# Patient Record
Sex: Male | Born: 2005 | Race: Black or African American | Hispanic: No | Marital: Single | Smoking: Never smoker
Health system: Southern US, Community
[De-identification: ages and names within clinical notes are randomized; demographics above are authoritative.]

## PROBLEM LIST (undated history)

## (undated) HISTORY — PX: TONSILLECTOMY: SUR1361

---
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
## 2023-02-10 IMAGING — CR DG TIBIA/FIBULA 2V*R*
2 series · 2 of 2 positions shown · non-contrast
Comparison: None.

CLINICAL DATA: Right leg pain without recent injury.

EXAM:
RIGHT TIBIA AND FIBULA - 2 VIEW

[x tib-fib ap right (1 of 2)]
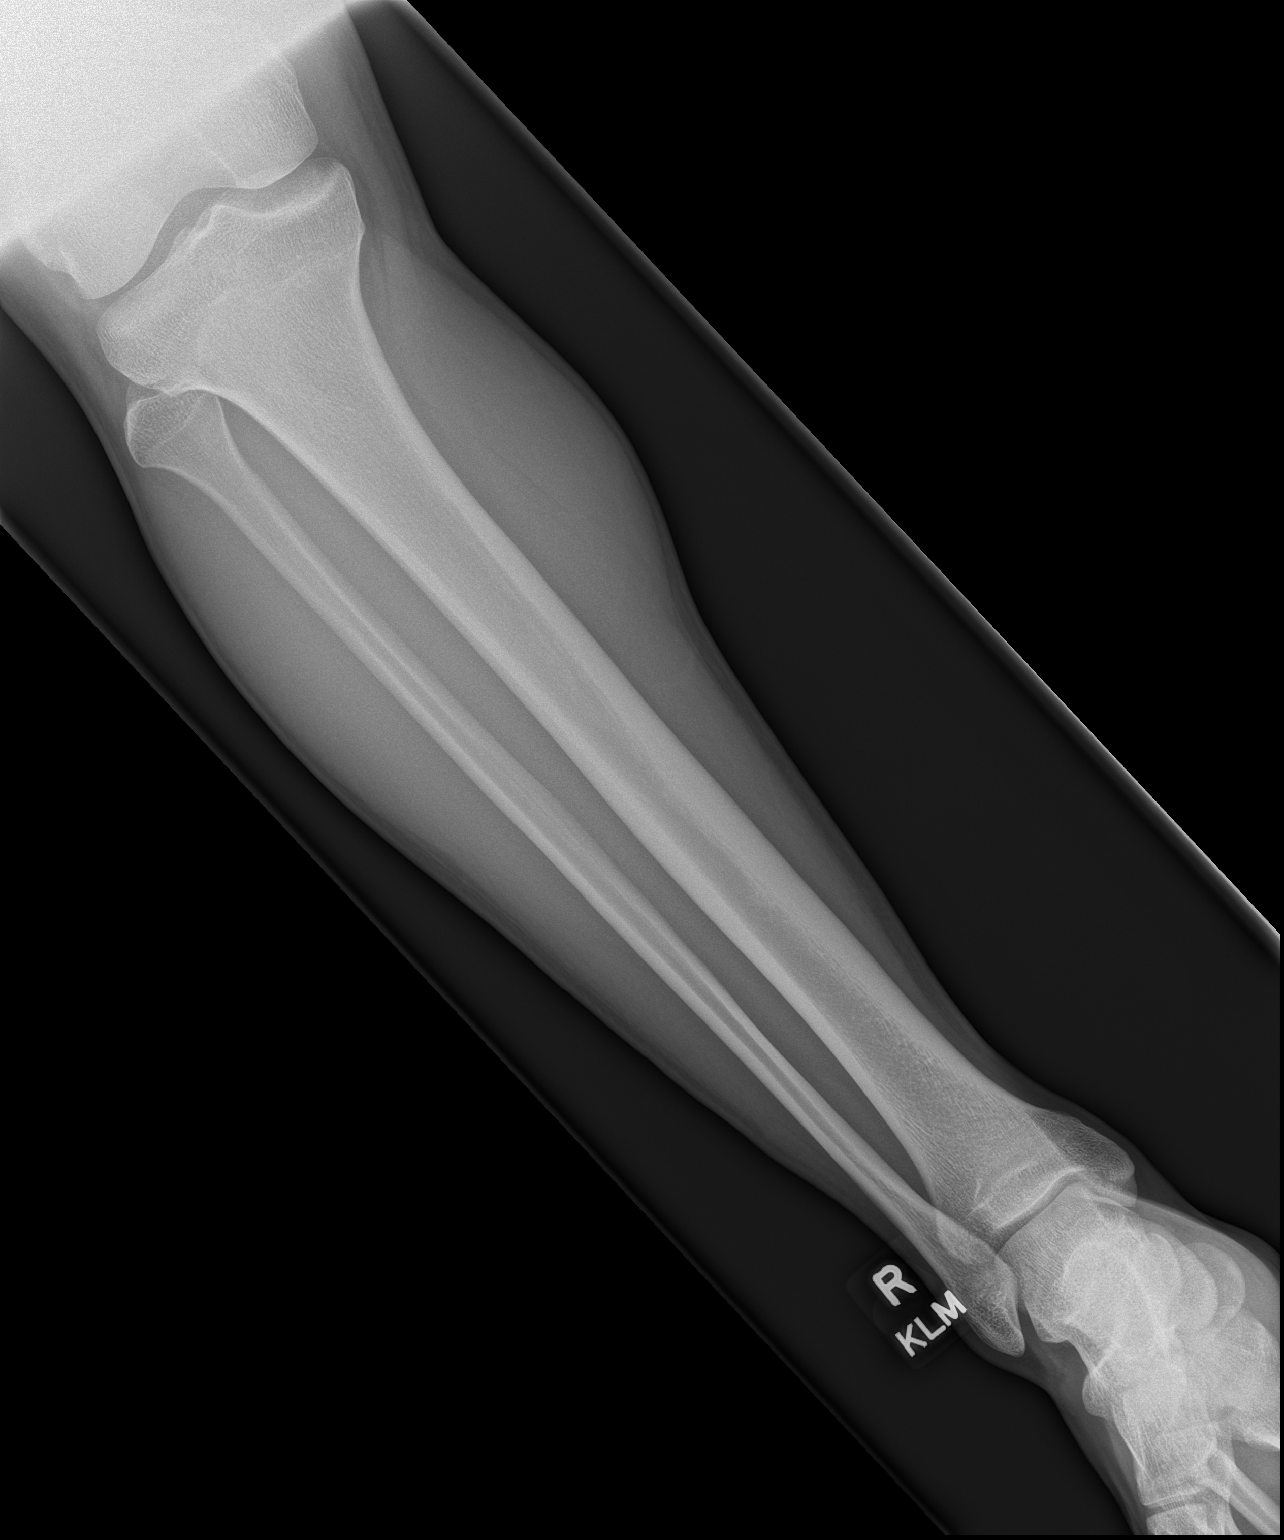

[x tib-fib ap right (2 of 2)]
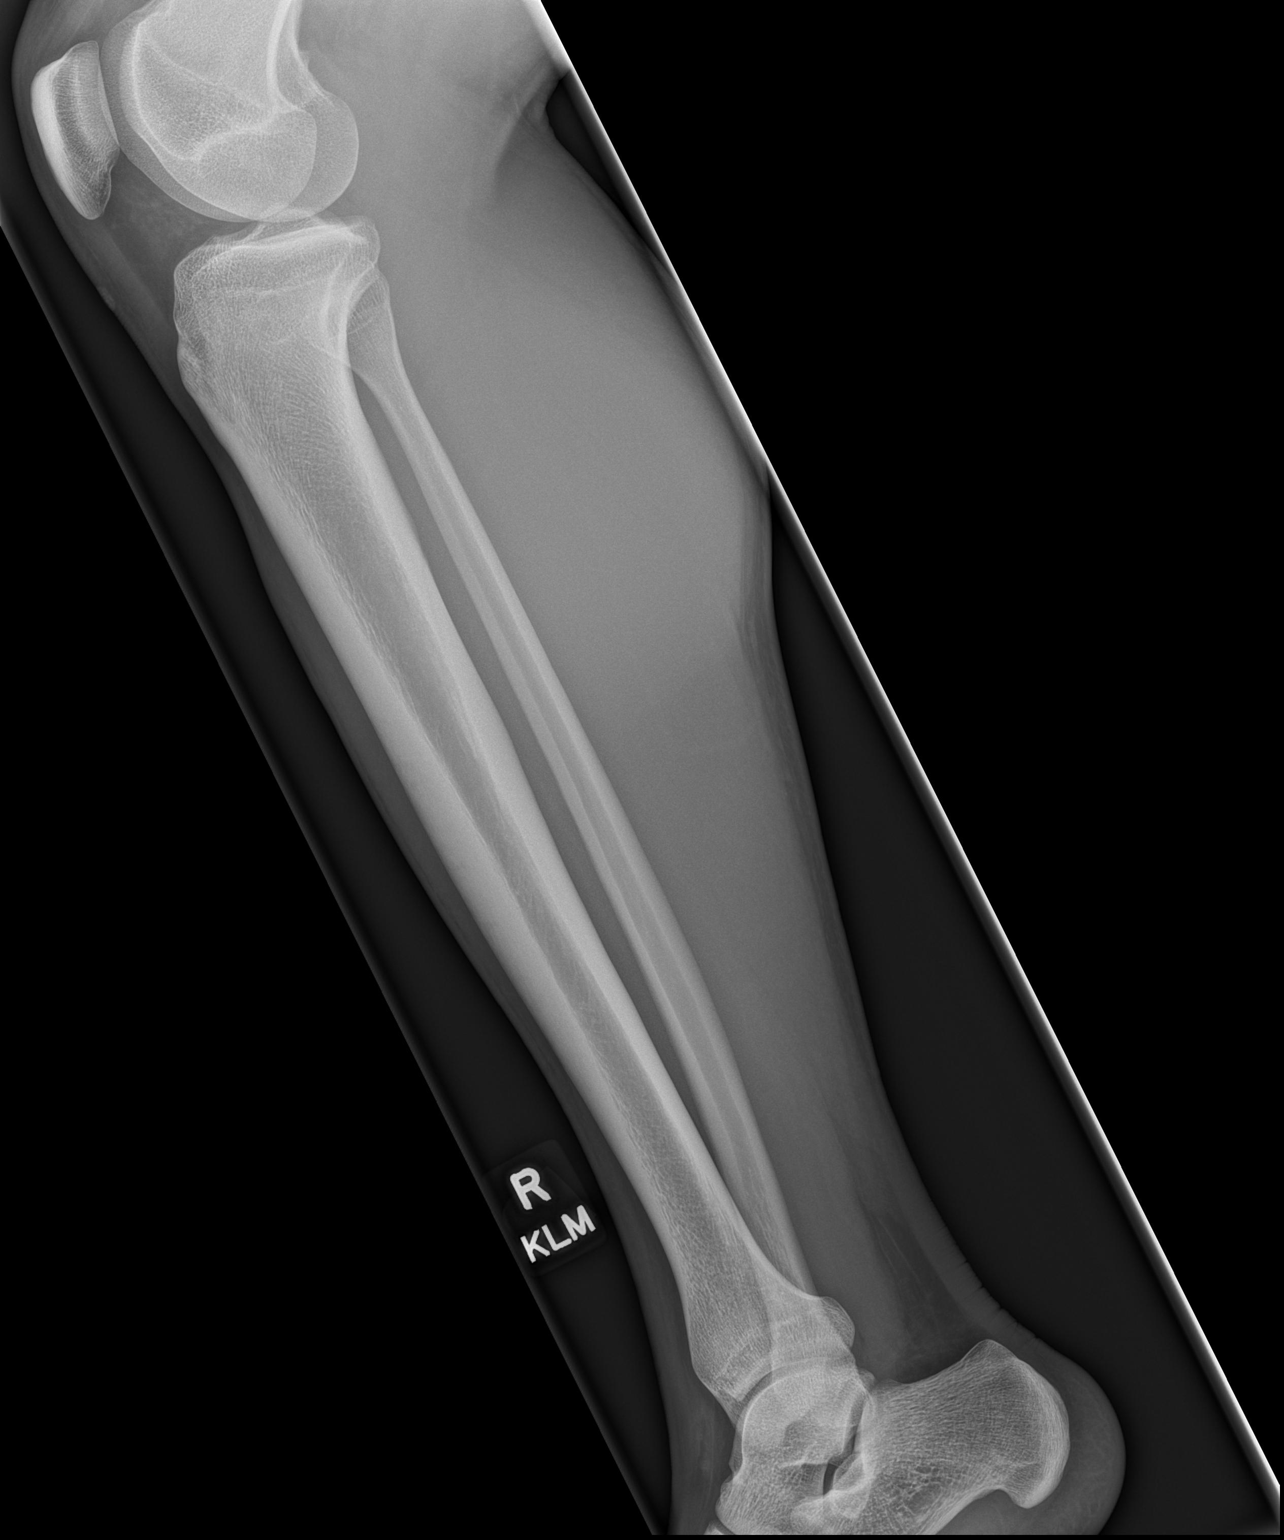

[2 of 2 positions shown; findings below may reference images not displayed]

FINDINGS: There is no evidence of fracture or other focal bone lesions. Soft
tissues are unremarkable.
IMPRESSION: Negative.

## 2023-02-10 IMAGING — CR DG FINGER LITTLE 2+V*L*
3 series · 3 of 3 positions shown · non-contrast
Comparison: None.

CLINICAL DATA: Left fifth finger pain after injury 3 months ago.

EXAM:
LEFT LITTLE FINGER 2+V

[x finger pa left]
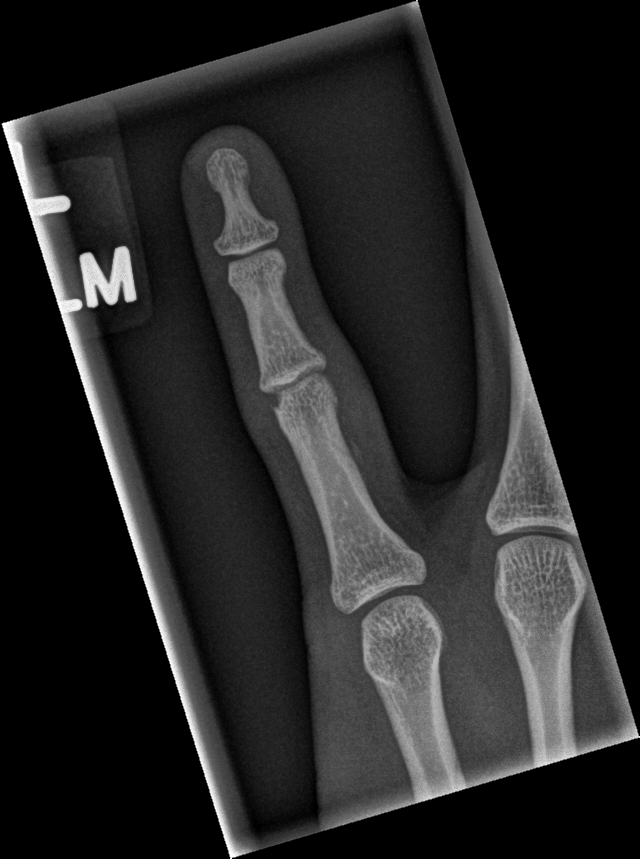

[x finger obl left]
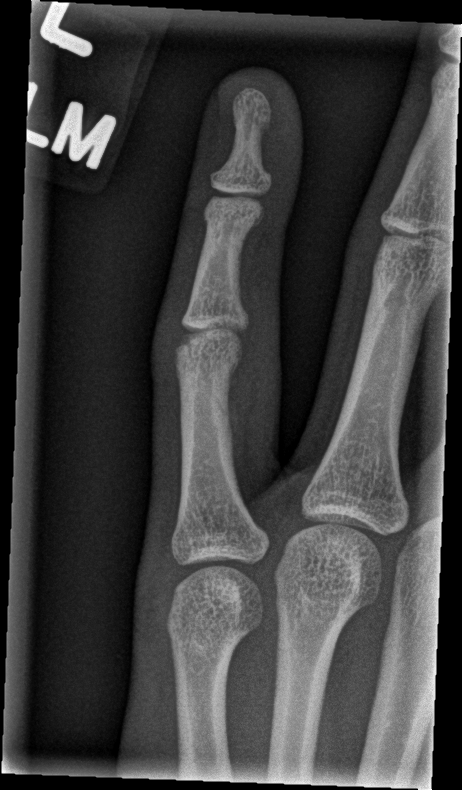

[x finger lat left]
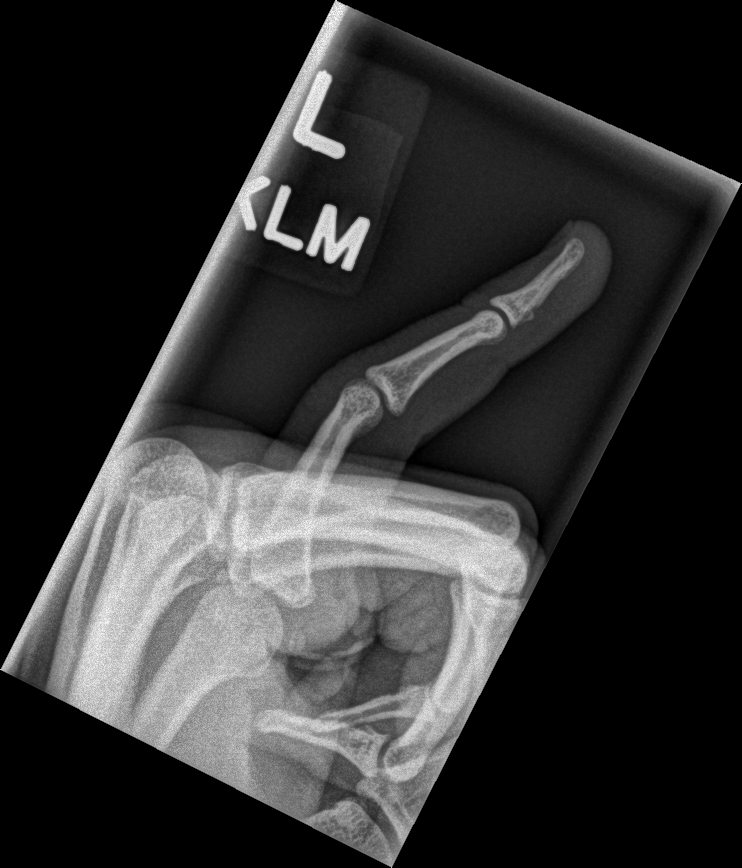

[3 of 3 positions shown; findings below may reference images not displayed]

FINDINGS: Slight irregularity is seen involving the volar aspect of the
proximal portion of the fifth distal phalanx suggesting fracture of
indeterminate age. No other bony abnormality is noted. The joint
spaces appear to be unremarkable. No soft tissue abnormality is
noted.
IMPRESSION: Slight irregularity is seen involving volar aspect of proximal
portion of fifth distal phalanx suggesting fracture of indeterminate
age.
